# Patient Record
Sex: Male | Born: 1995 | Race: White | Hispanic: No | Marital: Single | Smoking: Never smoker
Health system: Southern US, Community
[De-identification: ages and names within clinical notes are randomized; demographics above are authoritative.]

---

## 2019-02-16 ENCOUNTER — Other Ambulatory Visit (HOSPITAL_COMMUNITY): Payer: Self-pay | Admitting: Ophthalmology

## 2019-02-16 ENCOUNTER — Other Ambulatory Visit: Payer: Self-pay | Admitting: Ophthalmology

## 2019-02-16 DIAGNOSIS — H4901 Third [oculomotor] nerve palsy, right eye: Secondary | ICD-10-CM

## 2019-03-01 ENCOUNTER — Ambulatory Visit: Payer: Self-pay

## 2019-03-01 ENCOUNTER — Ambulatory Visit
Admission: RE | Admit: 2019-03-01 | Discharge: 2019-03-01 | Disposition: A | Discharge status: Home / Self Care | Payer: BC Managed Care – PPO | Source: Ambulatory Visit | Attending: Ophthalmology | Admitting: Ophthalmology

## 2019-03-01 ENCOUNTER — Other Ambulatory Visit: Payer: Self-pay

## 2019-03-01 DIAGNOSIS — H4901 Third [oculomotor] nerve palsy, right eye: Secondary | ICD-10-CM | POA: Insufficient documentation

## 2019-03-01 MED ORDER — GADOBUTROL 1 MMOL/ML IV SOLN
8.0000 mL | Freq: Once | INTRAVENOUS | Status: AC | PRN
  Administered 2019-03-01: 7.5 mL via INTRAVENOUS

## 2019-03-07 ENCOUNTER — Other Ambulatory Visit: Payer: Self-pay | Admitting: Neurology

## 2019-03-07 DIAGNOSIS — G35 Multiple sclerosis: Secondary | ICD-10-CM

## 2019-03-08 ENCOUNTER — Encounter: Payer: Self-pay | Admitting: *Deleted

## 2019-03-14 ENCOUNTER — Other Ambulatory Visit: Payer: Self-pay

## 2019-03-14 ENCOUNTER — Ambulatory Visit
Admission: RE | Admit: 2019-03-14 | Discharge: 2019-03-14 | Disposition: A | Discharge status: Home / Self Care | Payer: BC Managed Care – PPO | Source: Ambulatory Visit | Attending: Neurology | Admitting: Neurology

## 2019-03-14 DIAGNOSIS — G35 Multiple sclerosis: Secondary | ICD-10-CM | POA: Diagnosis present

## 2019-03-14 LAB — CSF CELL COUNT WITH DIFFERENTIAL
Eosinophils, CSF: 0 %
Lymphs, CSF: 95 %
Monocyte-Macrophage-Spinal Fluid: 5 %
RBC Count, CSF: 5 /mm3 — ABNORMAL HIGH (ref 0–3)
Segmented Neutrophils-CSF: 0 %
Tube #: 3
WBC, CSF: 31 /mm3 (ref 0–5)

## 2019-03-14 LAB — ALBUMIN, PLEURAL OR PERITONEAL FLUID: Albumin, Fluid: <1 g/dL

## 2019-03-14 LAB — ALBUMIN: Albumin: 4.5 g/dL (ref 3.5–5.0)

## 2019-03-14 LAB — PROTEIN, CSF: Total  Protein, CSF: 48 mg/dL — ABNORMAL HIGH (ref 15–45)

## 2019-03-14 LAB — APTT: aPTT: 35 seconds (ref 24–36)

## 2019-03-14 LAB — GLUCOSE, RANDOM: Glucose, Bld: 105 mg/dL — ABNORMAL HIGH (ref 70–99)

## 2019-03-14 LAB — PROTIME-INR
INR: 1 (ref 0.8–1.2)
Prothrombin Time: 12.7 seconds (ref 11.4–15.2)

## 2019-03-14 LAB — GLUCOSE, CSF: Glucose, CSF: 65 mg/dL (ref 40–70)

## 2019-03-14 LAB — PATHOLOGIST SMEAR REVIEW

## 2019-03-14 MED ORDER — ACETAMINOPHEN 500 MG PO TABS
1000.0000 mg | ORAL_TABLET | Freq: Four times a day (QID) | ORAL | Status: DC | PRN
  Filled 2019-03-14: qty 2

## 2019-03-14 NOTE — Discharge Instructions (Signed)
Lumbar Puncture, Care After This sheet gives you information about how to care for yourself after your procedure. Your health care provider may also give you more specific instructions. If you have problems or questions, contact your health care provider. What can I expect after the procedure? After the procedure, it is common to have:  Mild discomfort or pain at the puncture site.  A mild headache that is relieved with pain medicines. Follow these instructions at home: Activity   Lie down flat or rest for as long as directed by your health care provider.  Return to your normal activities as told by your health care provider. Ask your health care provider what activities are safe for you.  Avoid lifting anything heavier than 10 lb (4.5 kg) for at least 12 hours after the procedure.  Do not drive for 24 hours if you were given a medicine to help you relax (sedative) during your procedure.  Do not drive or use heavy machinery while taking prescription pain medicine. Puncture site care  Remove or change your bandage (dressing) as told by your health care provider.  Check your puncture area every day for signs of infection. Check for: ? More pain. ? Redness or swelling. ? Fluid or blood leaking from the puncture site. ? Warmth. ? Pus or a bad smell. General instructions  Take over-the-counter and prescription medicines only as told by your health care provider.  Drink enough fluids to keep your urine clear or pale yellow. Your health care provider may recommend drinking caffeine to prevent a headache.  Keep all follow-up visits as told by your health care provider. This is important. Contact a health care provider if:  You have fever or chills.  You have nausea or vomiting.  You have a headache that lasts for more than 2 days or does not get better with medicine. Get help right away if:  You develop any of the following in your  legs: ? Weakness. ? Numbness. ? Tingling.  You are unable to control when you urinate or have a bowel movement (incontinence).  You have signs of infection around your puncture site, such as: ? More pain. ? Redness or swelling. ? Fluid or blood leakage. ? Warmth. ? Pus or a bad smell.  You are dizzy or you feel like you might faint.  You have a severe headache, especially when you sit or stand. Summary  A lumbar puncture is a procedure in which a small needle is inserted into the lower back to remove fluid that surrounds the brain and spinal cord.  After this procedure, it is common to have a headache and pain around the needle insertion area.  Lying flat, staying hydrated, and drinking caffeine can help prevent headaches.  Monitor your needle insertion site for signs of infection, including warmth, fluid, or more pain.  Get help right away if you develop leg weakness, leg numbness, incontinence, or severe headaches. This information is not intended to replace advice given to you by your health care provider. Make sure you discuss any questions you have with your health care provider. Document Revised: 03/31/2016 Document Reviewed: 03/31/2016 Elsevier Patient Education  2020 [employer]  *FOLLOW UP WITH DR POTTER AS INSTRUCTED*

## 2019-03-14 NOTE — Progress Notes (Signed)
Unable to reach Dr. Malvin Johns via phone to report critical high CSF WBC.  Results walked to [facility] Neurology office and given to Dr. Daisy Blossom nurse Toni Amend.  Dr. Malvin Johns in with a patient but reviewed results with Toni Amend and provided instruction to let the patient go home when recovered.  Dr. Malvin Johns will contact the patient at a later time and discuss results.

## 2019-03-14 NOTE — Progress Notes (Signed)
Patient to room 24 in same day surgery via stretcher from radiology s/p lumbar puncture; patient lying flat. No c/o pain at this time. Respirations even and unlabored; skin warm and dry. Patient offered food and drink.  Will await further instruction from radiologist.

## 2019-03-15 ENCOUNTER — Other Ambulatory Visit: Payer: Self-pay | Admitting: Neurology

## 2019-03-15 DIAGNOSIS — G35 Multiple sclerosis: Secondary | ICD-10-CM

## 2019-03-16 LAB — IGG CSF INDEX
Albumin CSF-mCnc: 32 mg/dL (ref 11–48)
Albumin: 4.7 g/dL (ref 4.1–5.2)
CSF IgG Index: 0.6 (ref 0.0–0.7)
IgG (Immunoglobin G), Serum: 1066 mg/dL (ref 603–1613)
IgG, CSF: 4.3 mg/dL (ref 0.0–8.6)
IgG/Alb Ratio, CSF: 0.13 (ref 0.00–0.25)

## 2019-03-17 LAB — CSF CULTURE W GRAM STAIN: Culture: NO GROWTH

## 2019-03-19 ENCOUNTER — Other Ambulatory Visit: Payer: Self-pay

## 2019-03-19 ENCOUNTER — Ambulatory Visit
Admission: RE | Admit: 2019-03-19 | Discharge: 2019-03-19 | Disposition: A | Discharge status: Home / Self Care | Payer: BC Managed Care – PPO | Source: Ambulatory Visit | Attending: Neurology | Admitting: Neurology

## 2019-03-19 DIAGNOSIS — G35 Multiple sclerosis: Secondary | ICD-10-CM | POA: Insufficient documentation

## 2019-03-19 LAB — OLIGOCLONAL BANDS, CSF + SERM

## 2019-06-01 ENCOUNTER — Ambulatory Visit: Payer: BC Managed Care – PPO | Attending: Internal Medicine

## 2019-06-01 DIAGNOSIS — Z23 Encounter for immunization: Secondary | ICD-10-CM

## 2019-06-01 NOTE — Progress Notes (Signed)
   Covid-19 Vaccination Clinic  Name:  Shane Acosta    MRN: [id number] DOB: Jul 29, 1995  06/01/2019  Shane Acosta was observed post Covid-19 immunization for 15 minutes without incident. He was provided with Vaccine Information Sheet and instruction to access the V-Safe system.   Shane Acosta was instructed to call 911 with any severe reactions post vaccine: Marland Kitchen Difficulty breathing  . Swelling of face and throat  . A fast heartbeat  . A bad rash all over body  . Dizziness and weakness   Immunizations Administered    Name Date Dose VIS Date Route   Pfizer COVID-19 Vaccine 06/01/2019 10:37 AM 0.3 mL 02/09/2019 Intramuscular   Manufacturer: [employer], [employer]   Lot: [phone] [id number]   NDC: [address]-4

## 2019-06-26 ENCOUNTER — Ambulatory Visit: Payer: BC Managed Care – PPO | Attending: Internal Medicine

## 2019-06-26 DIAGNOSIS — Z23 Encounter for immunization: Secondary | ICD-10-CM

## 2019-06-26 NOTE — Progress Notes (Signed)
   Covid-19 Vaccination Clinic  Name:  Shane Acosta    MRN: [id number] DOB: Jul 22, 1995  06/26/2019  Mr. [name] was observed post Covid-19 immunization for 15 minutes without incident. He was provided with Vaccine Information Sheet and instruction to access the V-Safe system.   Mr. [name] was instructed to call 911 with any severe reactions post vaccine: Marland Kitchen Difficulty breathing  . Swelling of face and throat  . A fast heartbeat  . A bad rash all over body  . Dizziness and weakness   Immunizations Administered    Name Date Dose VIS Date Route   Pfizer COVID-19 Vaccine 06/26/2019 11:51 AM 0.3 mL 04/25/2018 Intramuscular   Manufacturer: [employer], [employer]   Lot: [id number]   NDC: [address]-2

## 2019-09-26 ENCOUNTER — Encounter (HOSPITAL_COMMUNITY): Payer: Self-pay

## 2019-09-26 ENCOUNTER — Other Ambulatory Visit: Payer: Self-pay

## 2019-09-26 ENCOUNTER — Encounter (HOSPITAL_COMMUNITY)
Admission: RE | Admit: 2019-09-26 | Discharge: 2019-09-26 | Disposition: A | Discharge status: Home / Self Care | Payer: BC Managed Care – PPO | Source: Ambulatory Visit | Attending: Neurology | Admitting: Neurology

## 2019-09-26 DIAGNOSIS — Z79899 Other long term (current) drug therapy: Secondary | ICD-10-CM | POA: Insufficient documentation

## 2019-09-26 DIAGNOSIS — G35 Multiple sclerosis: Secondary | ICD-10-CM | POA: Insufficient documentation

## 2019-09-26 MED ORDER — METHYLPREDNISOLONE SODIUM SUCC 125 MG IJ SOLR
125.0000 mg | Freq: Once | INTRAMUSCULAR | Status: AC
  Administered 2019-09-26: 125 mg via INTRAVENOUS
  Filled 2019-09-26: qty 2

## 2019-09-26 MED ORDER — DIPHENHYDRAMINE HCL 50 MG/ML IJ SOLN
50.0000 mg | Freq: Once | INTRAMUSCULAR | Status: AC
  Administered 2019-09-26: 50 mg via INTRAVENOUS
  Filled 2019-09-26: qty 1

## 2019-09-26 MED ORDER — ACETAMINOPHEN 325 MG PO TABS
650.0000 mg | ORAL_TABLET | Freq: Once | ORAL | Status: AC
  Administered 2019-09-26: 650 mg via ORAL
  Filled 2019-09-26: qty 2

## 2019-09-26 MED ORDER — SODIUM CHLORIDE 0.9 % IV SOLN
300.0000 mg | Freq: Once | INTRAVENOUS | Status: AC
  Administered 2019-09-26: 300 mg via INTRAVENOUS
  Filled 2019-09-26: qty 10

## 2019-10-11 ENCOUNTER — Encounter (HOSPITAL_COMMUNITY)
Admission: RE | Admit: 2019-10-11 | Discharge: 2019-10-11 | Disposition: A | Discharge status: Home / Self Care | Payer: BC Managed Care – PPO | Source: Ambulatory Visit | Attending: Neurology | Admitting: Neurology

## 2019-10-11 ENCOUNTER — Encounter (HOSPITAL_COMMUNITY): Payer: Self-pay

## 2019-10-11 ENCOUNTER — Other Ambulatory Visit: Payer: Self-pay

## 2019-10-11 DIAGNOSIS — G35 Multiple sclerosis: Secondary | ICD-10-CM | POA: Diagnosis present

## 2019-10-11 MED ORDER — METHYLPREDNISOLONE SODIUM SUCC 125 MG IJ SOLR
100.0000 mg | Freq: Once | INTRAMUSCULAR | Status: AC
  Administered 2019-10-11: 100 mg via INTRAVENOUS
  Filled 2019-10-11: qty 2

## 2019-10-11 MED ORDER — SODIUM CHLORIDE 0.9 % IV SOLN: Freq: Once | INTRAVENOUS | Status: AC

## 2019-10-11 MED ORDER — DIPHENHYDRAMINE HCL 50 MG/ML IJ SOLN
50.0000 mg | Freq: Once | INTRAMUSCULAR | Status: AC
  Administered 2019-10-11: 50 mg via INTRAVENOUS
  Filled 2019-10-11: qty 1

## 2019-10-11 MED ORDER — ACETAMINOPHEN 325 MG PO TABS
650.0000 mg | ORAL_TABLET | Freq: Once | ORAL | Status: AC
  Administered 2019-10-11: 650 mg via ORAL
  Filled 2019-10-11: qty 2

## 2019-10-11 MED ORDER — SODIUM CHLORIDE 0.9 % IV SOLN
600.0000 mg | Freq: Once | INTRAVENOUS | Status: AC
  Administered 2019-10-11: 600 mg via INTRAVENOUS
  Filled 2019-10-11: qty 20

## 2020-04-14 ENCOUNTER — Encounter (HOSPITAL_COMMUNITY)
Admission: RE | Admit: 2020-04-14 | Discharge: 2020-04-14 | Disposition: A | Discharge status: Home / Self Care | Payer: BC Managed Care – PPO | Source: Ambulatory Visit | Attending: Neurology | Admitting: Neurology

## 2020-04-14 ENCOUNTER — Encounter (HOSPITAL_COMMUNITY): Payer: Self-pay

## 2020-04-14 ENCOUNTER — Other Ambulatory Visit: Payer: Self-pay

## 2020-04-14 DIAGNOSIS — G35 Multiple sclerosis: Secondary | ICD-10-CM | POA: Diagnosis present

## 2020-04-14 MED ORDER — METHYLPREDNISOLONE SODIUM SUCC 125 MG IJ SOLR
INTRAMUSCULAR | Status: AC
  Filled 2020-04-14: qty 2

## 2020-04-14 MED ORDER — METHYLPREDNISOLONE SODIUM SUCC 125 MG IJ SOLR
125.0000 mg | Freq: Once | INTRAMUSCULAR | Status: AC
  Administered 2020-04-14: 125 mg via INTRAVENOUS
  Filled 2020-04-14: qty 2

## 2020-04-14 MED ORDER — ACETAMINOPHEN 325 MG PO TABS
650.0000 mg | ORAL_TABLET | Freq: Once | ORAL | Status: AC
  Administered 2020-04-14: 650 mg via ORAL
  Filled 2020-04-14: qty 2

## 2020-04-14 MED ORDER — DIPHENHYDRAMINE HCL 50 MG/ML IJ SOLN
50.0000 mg | Freq: Once | INTRAMUSCULAR | Status: AC
  Administered 2020-04-14: 50 mg via INTRAVENOUS
  Filled 2020-04-14: qty 1

## 2020-04-14 MED ORDER — SODIUM CHLORIDE 0.9 % IV SOLN
600.0000 mg | Freq: Once | INTRAVENOUS | Status: AC
  Administered 2020-04-14: 600 mg via INTRAVENOUS
  Filled 2020-04-14: qty 20

## 2020-04-14 MED ORDER — SODIUM CHLORIDE 0.9 % IV SOLN: Freq: Once | INTRAVENOUS | Status: DC

## 2020-04-18 ENCOUNTER — Other Ambulatory Visit: Payer: Self-pay | Admitting: Neurology

## 2020-04-18 DIAGNOSIS — G35 Multiple sclerosis: Secondary | ICD-10-CM

## 2020-04-27 ENCOUNTER — Other Ambulatory Visit: Payer: Self-pay

## 2020-04-27 ENCOUNTER — Ambulatory Visit
Admission: RE | Admit: 2020-04-27 | Discharge: 2020-04-27 | Disposition: A | Discharge status: Home / Self Care | Payer: BC Managed Care – PPO | Source: Ambulatory Visit | Attending: Neurology | Admitting: Neurology

## 2020-04-27 DIAGNOSIS — G35 Multiple sclerosis: Secondary | ICD-10-CM | POA: Insufficient documentation

## 2020-10-13 ENCOUNTER — Inpatient Hospital Stay (HOSPITAL_COMMUNITY): Admission: RE | Admit: 2020-10-13 | Payer: BC Managed Care – PPO | Source: Ambulatory Visit

## 2020-10-15 ENCOUNTER — Other Ambulatory Visit: Payer: Self-pay

## 2020-10-15 ENCOUNTER — Encounter (HOSPITAL_COMMUNITY)
Admission: RE | Admit: 2020-10-15 | Discharge: 2020-10-15 | Disposition: A | Discharge status: Home / Self Care | Payer: BC Managed Care – PPO | Source: Ambulatory Visit | Attending: Neurology | Admitting: Neurology

## 2020-10-15 DIAGNOSIS — G35 Multiple sclerosis: Secondary | ICD-10-CM | POA: Diagnosis present

## 2020-10-15 MED ORDER — DIPHENHYDRAMINE HCL 25 MG PO CAPS
ORAL_CAPSULE | ORAL | Status: AC
  Filled 2020-10-15: qty 1

## 2020-10-15 MED ORDER — DIPHENHYDRAMINE HCL 50 MG/ML IJ SOLN
50.0000 mg | Freq: Once | INTRAMUSCULAR | Status: AC
  Administered 2020-10-15: 50 mg via INTRAVENOUS
  Filled 2020-10-15: qty 1

## 2020-10-15 MED ORDER — ACETAMINOPHEN 325 MG PO TABS
650.0000 mg | ORAL_TABLET | Freq: Once | ORAL | Status: AC
  Administered 2020-10-15: 650 mg via ORAL
  Filled 2020-10-15: qty 2

## 2020-10-15 MED ORDER — METHYLPREDNISOLONE SODIUM SUCC 125 MG IJ SOLR
125.0000 mg | Freq: Once | INTRAMUSCULAR | Status: AC
  Administered 2020-10-15: 125 mg via INTRAVENOUS

## 2020-10-15 MED ORDER — SODIUM CHLORIDE 0.9 % IV SOLN: INTRAVENOUS | Status: DC

## 2020-10-15 MED ORDER — METHYLPREDNISOLONE SODIUM SUCC 125 MG IJ SOLR
INTRAMUSCULAR | Status: AC
  Filled 2020-10-15: qty 2

## 2020-10-15 MED ORDER — SODIUM CHLORIDE 0.9 % IV SOLN
600.0000 mg | Freq: Once | INTRAVENOUS | Status: AC
  Administered 2020-10-15: 600 mg via INTRAVENOUS
  Filled 2020-10-15: qty 20

## 2021-04-10 ENCOUNTER — Other Ambulatory Visit: Payer: Self-pay | Admitting: Neurology

## 2021-04-10 DIAGNOSIS — G35 Multiple sclerosis: Secondary | ICD-10-CM

## 2021-04-17 ENCOUNTER — Ambulatory Visit
Admission: RE | Admit: 2021-04-17 | Discharge: 2021-04-17 | Disposition: A | Discharge status: Home / Self Care | Payer: BC Managed Care – PPO | Source: Ambulatory Visit | Attending: Neurology | Admitting: Neurology

## 2021-04-17 ENCOUNTER — Other Ambulatory Visit: Payer: Self-pay

## 2021-04-17 DIAGNOSIS — G35 Multiple sclerosis: Secondary | ICD-10-CM | POA: Insufficient documentation

## 2021-04-23 ENCOUNTER — Encounter (HOSPITAL_COMMUNITY)
Admission: RE | Admit: 2021-04-23 | Discharge: 2021-04-23 | Disposition: A | Discharge status: Home / Self Care | Payer: BC Managed Care – PPO | Source: Ambulatory Visit | Attending: Neurology | Admitting: Neurology

## 2021-05-14 ENCOUNTER — Encounter (HOSPITAL_COMMUNITY)
Admission: RE | Admit: 2021-05-14 | Discharge: 2021-05-14 | Disposition: A | Discharge status: Home / Self Care | Payer: BC Managed Care – PPO | Source: Ambulatory Visit | Attending: Neurology | Admitting: Neurology

## 2021-05-14 ENCOUNTER — Encounter (HOSPITAL_COMMUNITY): Payer: Self-pay

## 2021-05-14 DIAGNOSIS — G35 Multiple sclerosis: Secondary | ICD-10-CM | POA: Insufficient documentation

## 2021-05-14 MED ORDER — ACETAMINOPHEN 325 MG PO TABS
650.0000 mg | ORAL_TABLET | Freq: Once | ORAL | Status: AC
  Administered 2021-05-14: 650 mg via ORAL
  Filled 2021-05-14: qty 2

## 2021-05-14 MED ORDER — SODIUM CHLORIDE 0.9 % IV SOLN
600.0000 mg | Freq: Once | INTRAVENOUS | Status: AC
  Administered 2021-05-14: 600 mg via INTRAVENOUS
  Filled 2021-05-14: qty 20

## 2021-05-14 MED ORDER — DIPHENHYDRAMINE HCL 50 MG/ML IJ SOLN
50.0000 mg | Freq: Once | INTRAMUSCULAR | Status: AC
  Administered 2021-05-14: 50 mg via INTRAVENOUS
  Filled 2021-05-14: qty 1

## 2021-05-14 MED ORDER — METHYLPREDNISOLONE SODIUM SUCC 125 MG IJ SOLR
125.0000 mg | Freq: Once | INTRAMUSCULAR | Status: AC
  Administered 2021-05-14: 125 mg via INTRAVENOUS
  Filled 2021-05-14: qty 2

## 2021-05-14 MED ORDER — SODIUM CHLORIDE 0.9 % IV SOLN: Freq: Once | INTRAVENOUS | Status: AC

## 2021-11-17 ENCOUNTER — Encounter (HOSPITAL_COMMUNITY): Admission: RE | Admit: 2021-11-17 | Payer: BC Managed Care – PPO | Source: Ambulatory Visit

## 2022-04-13 IMAGING — MR MR HEAD W/O CM
13 series · 46 of 48 positions shown · non-contrast
Comparison: Brain MRI 04/27/2020.  Cervical spine MRI 04/27/2020.

CLINICAL DATA: Provided history: Multiple sclerosis. Additional
history provided by scanning technologist: Patient reports no new
symptoms.

EXAM:
MRI HEAD WITHOUT CONTRAST
TECHNIQUE: Multiplanar, multiecho pulse sequences of the brain and surrounding
structures were obtained without intravenous contrast.

[Series 5: ax dwi_tracew · axial · 3.0mm · 0.65mm/px · z∈[-118,+36]mm · 3 of 48 slices shown]
[im 1/48]
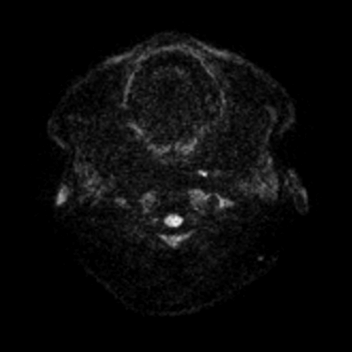
[im 24/48]
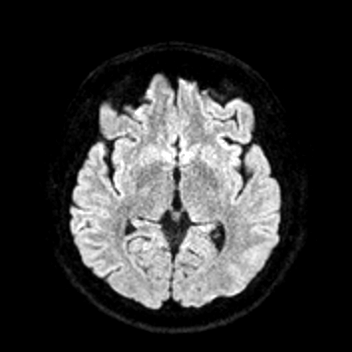
[im 48/48]
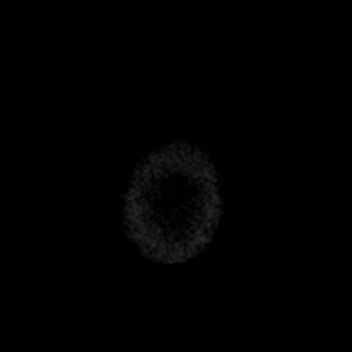

[Series 6: ax dwi_adc · axial · 3.0mm · 0.65mm/px · z∈[-118,+36]mm · 3 of 48 slices shown]
[im 1/48]
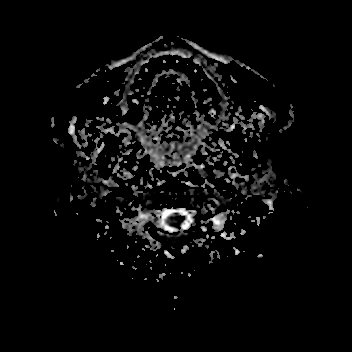
[im 24/48]
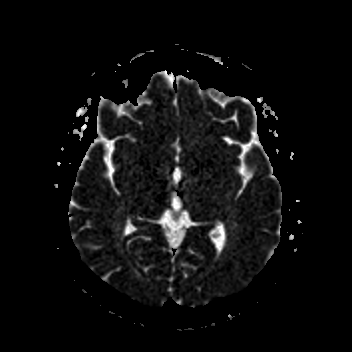
[im 48/48]
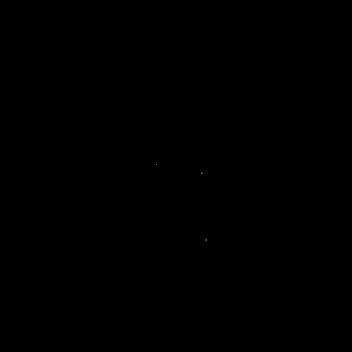

[Series 7: cor dwi_tracew · coronal · 5.0mm · 0.60mm/px · 3 of 38 slices shown]
[im 1/38]
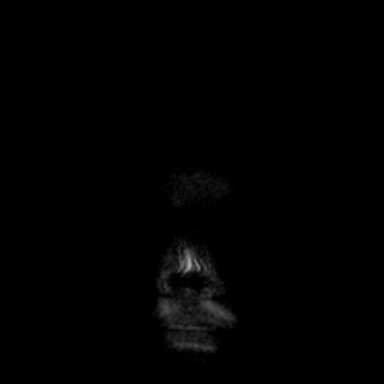
[im 19/38]
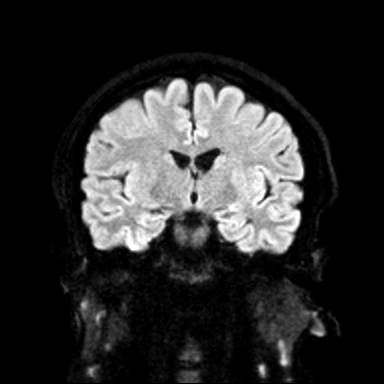
[im 38/38]
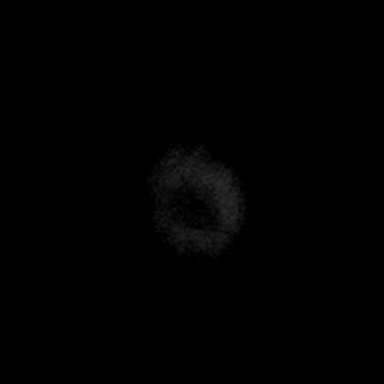

[Series 8: cor dwi_adc · coronal · 5.0mm · 0.60mm/px · 3 of 38 slices shown]
[im 1/38]
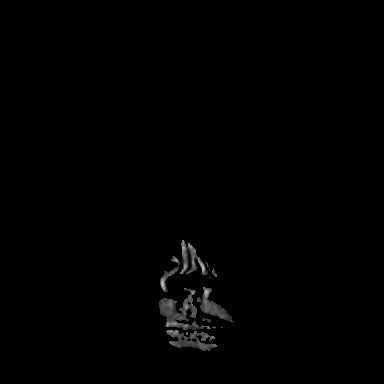
[im 19/38]
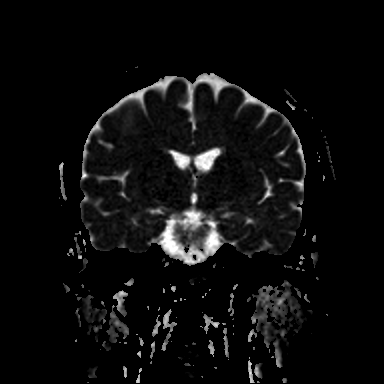
[im 38/38]
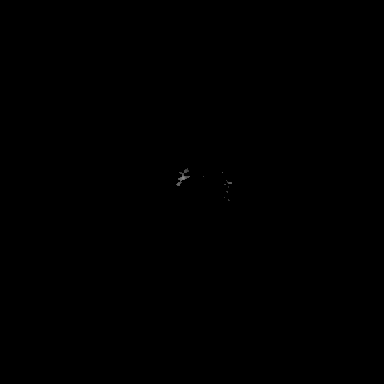

[Series 9: T1 · sagittal · 5.0mm · 0.62mm/px · 2 of 23 slices shown (1 of 2)]
[im 1/23]
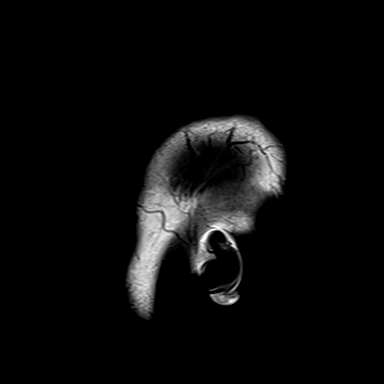
[im 23/23]
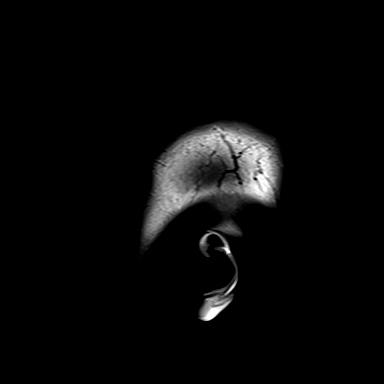

[Series 10: T2 · axial · 5.0mm · 0.53mm/px · z∈[-111,+31]mm · 2 of 25 slices shown (1 of 2)]
[im 1/25]
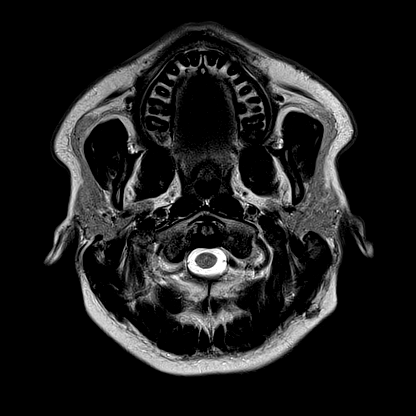
[im 25/25]
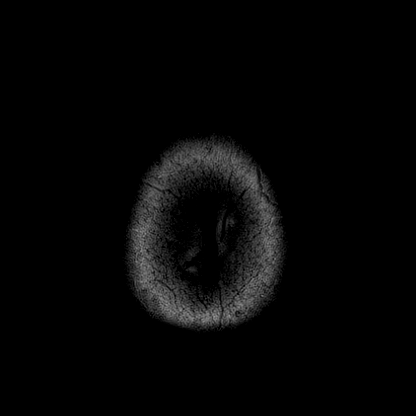

[Series 11: mag_images · axial · 3.0mm · 0.90mm/px · z∈[-128,+47]mm · 4 of 60 slices shown]
[im 1/60]
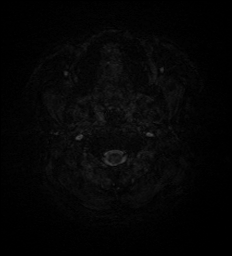
[im 20/60]
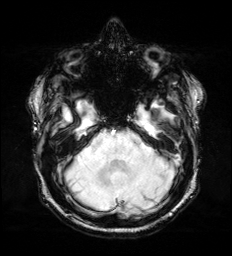
[im 40/60]
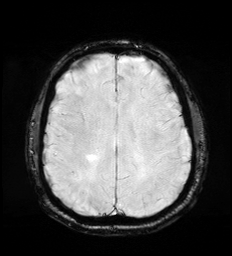
[im 60/60]
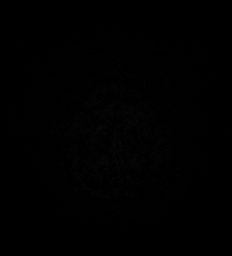

[Series 12: pha_images · axial · 3.0mm · 0.90mm/px · z∈[-128,+47]mm · 4 of 59 slices shown]
[im 1/59]
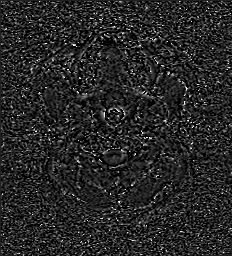
[im 20/59]
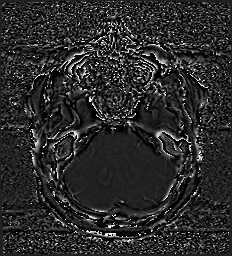
[im 39/59]
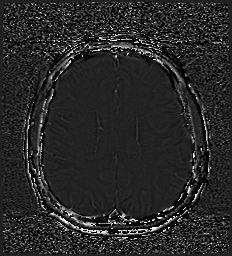
[im 59/59]
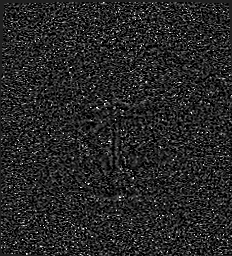

[Series 13: swi_images · axial · 3.0mm · 0.90mm/px · z∈[-128,+47]mm · 4 of 60 slices shown]
[im 1/60]
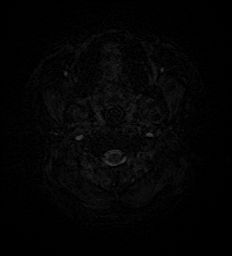
[im 20/60]
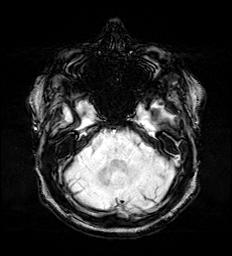
[im 40/60]
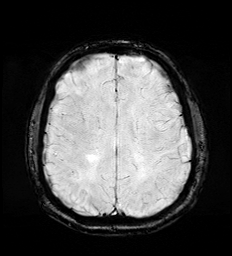
[im 60/60]
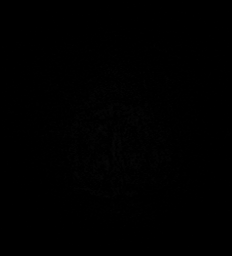

[Series 15: FLAIR · axial · 3.0mm · 0.53mm/px · z∈[-120,+40]mm · 4 of 55 slices shown (1 of 2)]
[im 1/55]
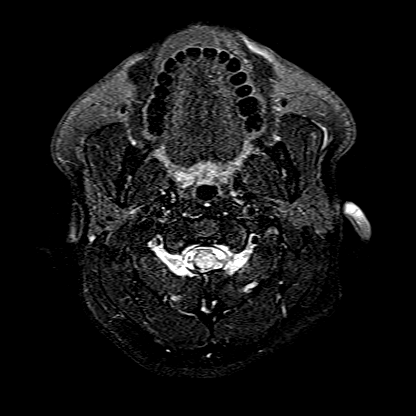
[im 19/55]
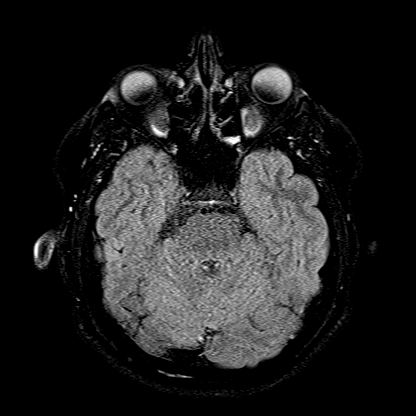
[im 37/55]
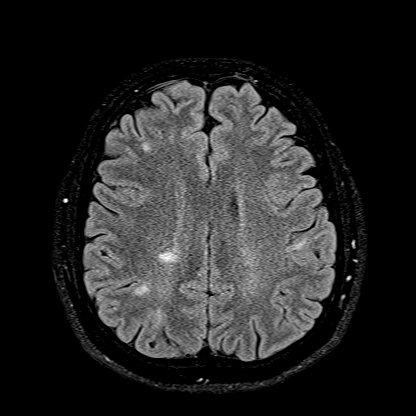
[im 55/55]
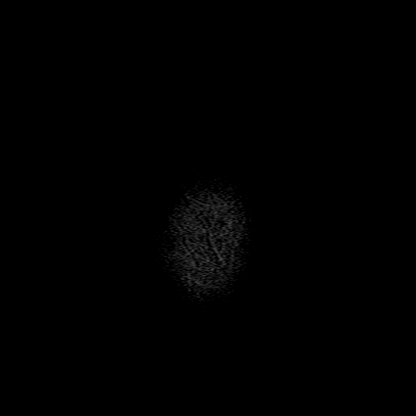

[Series 16: FLAIR · sagittal · 5.0mm · 0.94mm/px · 2 of 22 slices shown (2 of 2)]
[im 1/22]
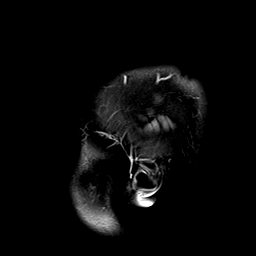
[im 22/22]
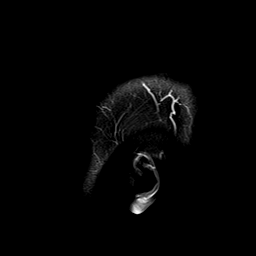

[Series 17: T1 · axial · 1.0mm · 0.98mm/px · z∈[-121,+37]mm · 10 of 160 slices shown (2 of 2)]
[im 1/160]
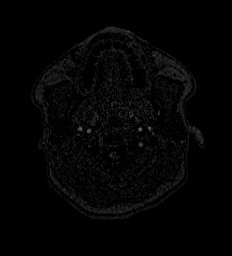
[im 15/160]
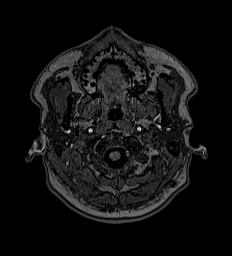
[im 29/160]
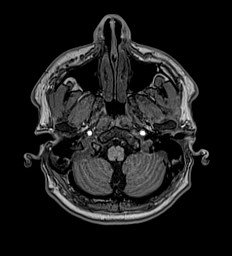
[im 44/160]
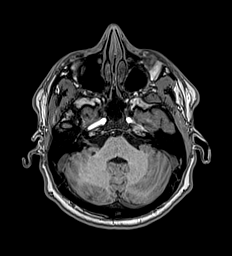
[im 58/160]
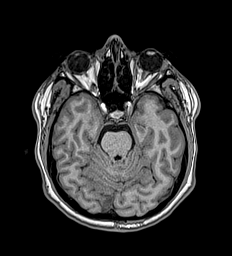
[im 73/160]
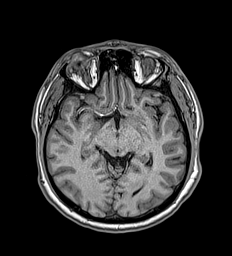
[im 87/160]
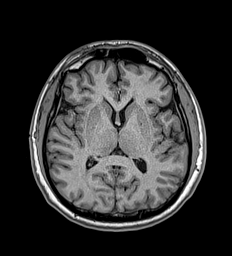
[im 116/160]
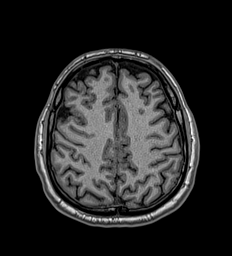
[im 131/160]
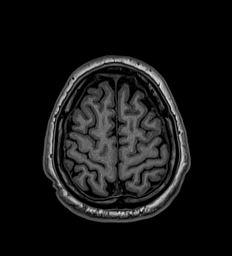
[im 160/160]
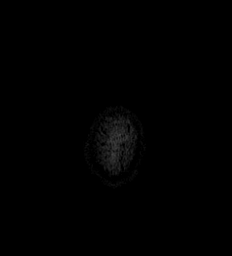

[Series 18: T2 · coronal · 5.0mm · 0.57mm/px · 2 of 29 slices shown (2 of 2)]
[im 1/29]
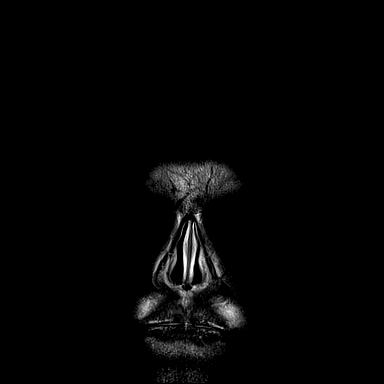
[im 29/29]
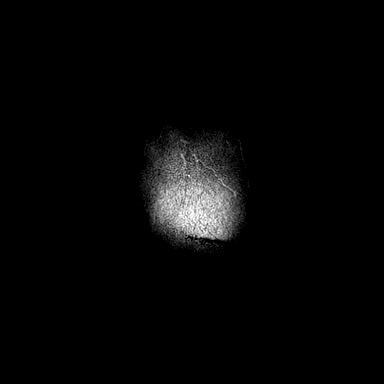

[46 of 48 positions shown; findings below may reference images not displayed]

FINDINGS: Brain:

Cerebral volume is normal.

Redemonstrated multifocal T2 FLAIR hyperintense signal abnormality
within the bilateral subcortical/juxtacortical and deep
periventricular white matter. As before, some of the lesions are
oriented perpendicular to the long axis of the lateral ventricles
(for instance as seen on series 16, image 9). No new lesion is
identified as compared to the prior exam of 04/27/2020. No definite
lesions are identified within the infratentorial brain.

There is no acute infarct.

No evidence of an intracranial mass.

No chronic intracranial blood products.

No extra-axial fluid collection.

No midline shift.

Vascular: Maintained flow voids within the proximal large arterial
vessels.

Skull and upper cervical spine: No focal suspicious marrow lesion.

Sinuses/Orbits: Visualized orbits show no acute finding. Mild
mucosal thickening within a posterior left ethmoid air cell.
IMPRESSION: Multiple T2 FLAIR hyperintense lesions within the bilateral cerebral
white matter, stable from the prior MRI of 04/27/2020 and compatible
with the provided history of multiple sclerosis.

## 2022-04-16 ENCOUNTER — Ambulatory Visit
Admission: RE | Admit: 2022-04-16 | Discharge: 2022-04-16 | Disposition: A | Discharge status: Home / Self Care | Payer: 59 | Source: Ambulatory Visit

## 2022-04-16 DIAGNOSIS — S99911A Unspecified injury of right ankle, initial encounter: Secondary | ICD-10-CM

## 2022-04-16 NOTE — ED Triage Notes (Signed)
Patient presents to UC for right ankle pain from a sprain yesterday.

## 2022-04-16 NOTE — Discharge Instructions (Signed)
Pt not evluated by provider

## 2022-04-16 NOTE — ED Provider Notes (Signed)
  Roderic Palau    CSN: [id number] Arrival date & time: 04/16/22  [id number]      History   Chief Complaint Chief Complaint  Patient presents with   Ankle Pain    Sprained my right ankle pretty bad Thursday morning, I just want to get it looked at. - Entered by patient    HPI Patricio Dubuque is a 27 y.o. male.   HPI  Pt not evaluated by provider.  History reviewed. No pertinent past medical history.  There are no problems to display for this patient.   History reviewed. No pertinent surgical history.     Home Medications    Prior to Admission medications   Not on File    Family History History reviewed. No pertinent family history.  Social History Social History   Tobacco Use   Smoking status: Never     Allergies   Patient has no known allergies.   Review of Systems Review of Systems   Physical Exam Triage Vital Signs ED Triage Vitals  Enc Vitals Group     BP      Pulse      Resp      Temp      Temp src      SpO2      Weight      Height      Head Circumference      Peak Flow      Pain Score      Pain Loc      Pain Edu?      Excl. in [place]?    No data found.  Updated Vital Signs There were no vitals taken for this visit.  Visual Acuity Right Eye Distance:   Left Eye Distance:   Bilateral Distance:    Right Eye Near:   Left Eye Near:    Bilateral Near:     Physical Exam   UC Treatments / Results  Labs (all labs ordered are listed, but only abnormal results are displayed) Labs Reviewed - No data to display  EKG   Radiology No results found.  Procedures Procedures (including critical care time)  Medications Ordered in UC Medications - No data to display  Initial Impression / Assessment and Plan / UC Course  I have reviewed the triage vital signs and the nursing notes.  Pertinent labs & imaging results that were available during my care of the patient were reviewed by me and considered in my medical decision  making (see chart for details).   Pt not evaluated by provider. Elects to go directly to ortho urgent care.   Final Clinical Impressions(s) / UC Diagnoses   Final diagnoses:  None   Discharge Instructions   None    ED Prescriptions   None    PDMP not reviewed this encounter.   Rose Phi, [place] 04/16/22 [phone]

## 2022-05-28 ENCOUNTER — Other Ambulatory Visit: Payer: Self-pay | Admitting: Neurology

## 2022-05-28 DIAGNOSIS — G35 Multiple sclerosis: Secondary | ICD-10-CM

## 2022-05-31 ENCOUNTER — Ambulatory Visit
Admission: RE | Admit: 2022-05-31 | Discharge: 2022-05-31 | Disposition: A | Discharge status: Home / Self Care | Payer: 59 | Source: Ambulatory Visit | Attending: Neurology | Admitting: Neurology

## 2022-05-31 DIAGNOSIS — G35 Multiple sclerosis: Secondary | ICD-10-CM | POA: Diagnosis not present

## 2023-06-20 ENCOUNTER — Other Ambulatory Visit: Payer: Self-pay | Admitting: Neurology

## 2023-06-20 DIAGNOSIS — G35 Multiple sclerosis: Secondary | ICD-10-CM

## 2023-11-18 ENCOUNTER — Ambulatory Visit
Admission: RE | Admit: 2023-11-18 | Discharge: 2023-11-18 | Disposition: A | Discharge status: Home / Self Care | Source: Ambulatory Visit | Attending: Neurology | Admitting: Neurology

## 2023-11-18 DIAGNOSIS — G35 Multiple sclerosis: Secondary | ICD-10-CM | POA: Insufficient documentation
